# Patient Record
Sex: Male | Born: 1985 | Race: White | Hispanic: No | Smoking: Current every day smoker
Health system: Southern US, Community
[De-identification: ages and names within clinical notes are randomized; demographics above are authoritative.]

## PROBLEM LIST (undated history)

## (undated) DIAGNOSIS — N200 Calculus of kidney: Secondary | ICD-10-CM

## (undated) DIAGNOSIS — F4310 Post-traumatic stress disorder, unspecified: Secondary | ICD-10-CM

## (undated) DIAGNOSIS — S069XAA Unspecified intracranial injury with loss of consciousness status unknown, initial encounter: Secondary | ICD-10-CM

## (undated) DIAGNOSIS — S069X9A Unspecified intracranial injury with loss of consciousness of unspecified duration, initial encounter: Secondary | ICD-10-CM

## (undated) DIAGNOSIS — I10 Essential (primary) hypertension: Secondary | ICD-10-CM

---

## 2012-07-07 ENCOUNTER — Emergency Department (HOSPITAL_COMMUNITY)

## 2012-07-07 ENCOUNTER — Encounter (HOSPITAL_COMMUNITY): Payer: Self-pay | Admitting: Emergency Medicine

## 2012-07-07 ENCOUNTER — Emergency Department (INDEPENDENT_AMBULATORY_CARE_PROVIDER_SITE_OTHER)
Admission: EM | Admit: 2012-07-07 | Discharge: 2012-07-07 | Disposition: A | Discharge status: Nursing Home (Includes ICF) | Source: Home / Self Care | Attending: Family Medicine | Admitting: Family Medicine

## 2012-07-07 ENCOUNTER — Emergency Department (HOSPITAL_COMMUNITY)
Admission: EM | Admit: 2012-07-07 | Discharge: 2012-07-07 | Disposition: A | Discharge status: Home / Self Care | Attending: Emergency Medicine | Admitting: Emergency Medicine

## 2012-07-07 DIAGNOSIS — R109 Unspecified abdominal pain: Secondary | ICD-10-CM | POA: Insufficient documentation

## 2012-07-07 DIAGNOSIS — Z8659 Personal history of other mental and behavioral disorders: Secondary | ICD-10-CM | POA: Insufficient documentation

## 2012-07-07 DIAGNOSIS — R319 Hematuria, unspecified: Secondary | ICD-10-CM | POA: Insufficient documentation

## 2012-07-07 DIAGNOSIS — R1031 Right lower quadrant pain: Secondary | ICD-10-CM

## 2012-07-07 DIAGNOSIS — R110 Nausea: Secondary | ICD-10-CM | POA: Insufficient documentation

## 2012-07-07 DIAGNOSIS — F17200 Nicotine dependence, unspecified, uncomplicated: Secondary | ICD-10-CM | POA: Insufficient documentation

## 2012-07-07 DIAGNOSIS — M545 Low back pain, unspecified: Secondary | ICD-10-CM | POA: Insufficient documentation

## 2012-07-07 DIAGNOSIS — Z87442 Personal history of urinary calculi: Secondary | ICD-10-CM | POA: Insufficient documentation

## 2012-07-07 DIAGNOSIS — R52 Pain, unspecified: Secondary | ICD-10-CM | POA: Insufficient documentation

## 2012-07-07 DIAGNOSIS — Z87820 Personal history of traumatic brain injury: Secondary | ICD-10-CM | POA: Insufficient documentation

## 2012-07-07 DIAGNOSIS — I10 Essential (primary) hypertension: Secondary | ICD-10-CM | POA: Insufficient documentation

## 2012-07-07 HISTORY — DX: Essential (primary) hypertension: I10

## 2012-07-07 HISTORY — DX: Unspecified intracranial injury with loss of consciousness status unknown, initial encounter: S06.9XAA

## 2012-07-07 HISTORY — DX: Post-traumatic stress disorder, unspecified: F43.10

## 2012-07-07 HISTORY — DX: Unspecified intracranial injury with loss of consciousness of unspecified duration, initial encounter: S06.9X9A

## 2012-07-07 LAB — POCT URINALYSIS DIP (DEVICE)
Glucose, UA: NEGATIVE mg/dL
Leukocytes, UA: NEGATIVE
Nitrite: NEGATIVE
Specific Gravity, Urine: 1.025 (ref 1.005–1.030)
Urobilinogen, UA: 0.2 mg/dL (ref 0.0–1.0)

## 2012-07-07 LAB — URINALYSIS, ROUTINE W REFLEX MICROSCOPIC
Glucose, UA: NEGATIVE mg/dL
Hgb urine dipstick: NEGATIVE
Leukocytes, UA: NEGATIVE
Specific Gravity, Urine: 1.022 (ref 1.005–1.030)
Urobilinogen, UA: 0.2 mg/dL (ref 0.0–1.0)

## 2012-07-07 LAB — CBC WITH DIFFERENTIAL/PLATELET
Lymphocytes Relative: 29 % (ref 12–46)
Lymphs Abs: 2.6 10*3/uL (ref 0.7–4.0)
Neutrophils Relative %: 60 % (ref 43–77)
Platelets: 298 10*3/uL (ref 150–400)
RBC: 5.08 MIL/uL (ref 4.22–5.81)
WBC: 8.8 10*3/uL (ref 4.0–10.5)

## 2012-07-07 LAB — BASIC METABOLIC PANEL
CO2: 27 mEq/L (ref 19–32)
GFR calc non Af Amer: >90 mL/min (ref >90)
Glucose, Bld: 89 mg/dL (ref 70–99)
Potassium: 4.2 mEq/L (ref 3.5–5.1)
Sodium: 142 mEq/L (ref 135–145)

## 2012-07-07 MED ORDER — PROMETHAZINE HCL 25 MG/ML IJ SOLN
25.0000 mg | INTRAMUSCULAR | Status: AC
  Administered 2012-07-07: 25 mg via INTRAMUSCULAR
  Filled 2012-07-07: qty 1

## 2012-07-07 MED ORDER — CIPROFLOXACIN HCL 500 MG PO TABS: 500.0000 mg | ORAL_TABLET | Freq: Two times a day (BID) | ORAL | Status: DC

## 2012-07-07 MED ORDER — HYDROMORPHONE HCL PF 1 MG/ML IJ SOLN
1.0000 mg | Freq: Once | INTRAMUSCULAR | Status: AC
  Administered 2012-07-07: 1 mg via INTRAMUSCULAR
  Filled 2012-07-07: qty 1

## 2012-07-07 MED ORDER — HYDROCODONE-ACETAMINOPHEN 5-325 MG PO TABS: 2.0000 | ORAL_TABLET | ORAL | Status: DC | PRN

## 2012-07-07 NOTE — ED Notes (Signed)
Unable to obtain e-signature due to signature pad not working.

## 2012-07-07 NOTE — ED Notes (Signed)
Pt c/o back pain and dysuria. Hx of kidney stones. Pt c/o right lower quadrant pain. Acute on set x last night. Pt has increased water intake

## 2012-07-07 NOTE — ED Provider Notes (Signed)
History     CSN: [id number]  Arrival date & time 07/07/12  1509   First MD Initiated Contact with Patient 07/07/12 1734      Chief Complaint  Patient presents with  . Abdominal Pain  . Hematuria  . Back Pain    (Consider location/radiation/quality/duration/timing/severity/associated sxs/prior treatment) HPI Comments: Patient presents to the ER with complaints of right-sided low back pain with pain radiating into his right lower abdomen. Symptoms began last night. He is at moderate to severe pain. Recent reports that he has noticed blood in his urine. He has had nausea without vomiting. There is no fever. Patient does report this is similar to when he has had kidney stones in the past. Patient return to care today and was sent to the ER because he had tenderness in the right lower abdomen area.  Patient is a 27 y.o. male presenting with abdominal pain, hematuria, and back pain.  Abdominal Pain Associated symptoms: hematuria   Hematuria Associated symptoms include abdominal pain.  Back Pain Associated symptoms: abdominal pain     Past Medical History  Diagnosis Date  . Hypertension   . PTSD (post-traumatic stress disorder)   . Traumatic brain injury     History reviewed. No pertinent past surgical history.  No family history on file.  History  Substance Use Topics  . Smoking status: Current Every Day Smoker -- 0.50 packs/day    Types: Cigarettes  . Smokeless tobacco: Not on file  . Alcohol Use: No     Comment: Quit 06/27/2012      Review of Systems  Gastrointestinal: Positive for abdominal pain.  Genitourinary: Positive for hematuria and flank pain.  Musculoskeletal: Positive for back pain.  All other systems reviewed and are negative.    Allergies  Review of patient's allergies indicates no known allergies.  Home Medications  No current outpatient prescriptions on file.  BP 123/77  Pulse 70  Temp(Src) 98.2 F (36.8 C) (Oral)  Resp 18  SpO2  96%  Physical Exam  Constitutional: He is oriented to person, place, and time. He appears well-developed and well-nourished. No distress.  HENT:  Head: Normocephalic and atraumatic.  Right Ear: Hearing normal.  Nose: Nose normal.  Mouth/Throat: Oropharynx is clear and moist and mucous membranes are normal.  Eyes: Conjunctivae and EOM are normal. Pupils are equal, round, and reactive to light.  Neck: Normal range of motion. Neck supple.  Cardiovascular: Normal rate, regular rhythm, S1 normal and S2 normal.  Exam reveals no gallop and no friction rub.   No murmur heard. Pulmonary/Chest: Effort normal and breath sounds normal. No respiratory distress. He exhibits no tenderness.  Abdominal: Soft. Normal appearance and bowel sounds are normal. There is no hepatosplenomegaly. There is tenderness in the right lower quadrant. There is no rebound, no guarding, no tenderness at McBurney's point and negative Murphy's sign. No hernia.  Musculoskeletal: Normal range of motion.  Neurological: He is alert and oriented to person, place, and time. He has normal strength. No cranial nerve deficit or sensory deficit. Coordination normal. GCS eye subscore is 4. GCS verbal subscore is 5. GCS motor subscore is 6.  Skin: Skin is warm, dry and intact. No rash noted. No cyanosis.  Psychiatric: He has a normal mood and affect. His speech is normal and behavior is normal. Thought content normal.    ED Course  Procedures (including critical care time)  Results for orders placed during the hospital encounter of 07/07/12 (from the past 24 hour(s))  CBC  WITH DIFFERENTIAL     Status: Abnormal   Collection Time    07/07/12  4:00 PM      Result Value Range   WBC 8.8  4.0 - 10.5 K/uL   RBC 5.08  4.22 - 5.81 MIL/uL   Hemoglobin 15.9  13.0 - 17.0 g/dL   HCT 16.1  09.6 - 04.5 %   MCV 84.3  78.0 - 100.0 fL   MCH 31.3  26.0 - 34.0 pg   MCHC 37.0 (*) 30.0 - 36.0 g/dL   RDW 40.9  81.1 - 91.4 %   Platelets 298  150 - 400  K/uL   Neutrophils Relative 60  43 - 77 %   Neutro Abs 5.3  1.7 - 7.7 K/uL   Lymphocytes Relative 29  12 - 46 %   Lymphs Abs 2.6  0.7 - 4.0 K/uL   Monocytes Relative 10  3 - 12 %   Monocytes Absolute 0.8  0.1 - 1.0 K/uL   Eosinophils Relative 1  0 - 5 %   Eosinophils Absolute 0.1  0.0 - 0.7 K/uL   Basophils Relative 1  0 - 1 %   Basophils Absolute 0.0  0.0 - 0.1 K/uL  BASIC METABOLIC PANEL     Status: None   Collection Time    07/07/12  4:00 PM      Result Value Range   Sodium 142  135 - 145 mEq/L   Potassium 4.2  3.5 - 5.1 mEq/L   Chloride 105  96 - 112 mEq/L   CO2 27  19 - 32 mEq/L   Glucose, Bld 89  70 - 99 mg/dL   BUN 11  6 - 23 mg/dL   Creatinine, Ser 7.82  0.50 - 1.35 mg/dL   Calcium 9.9  8.4 - 95.6 mg/dL   GFR calc non Af Amer >90  >90 mL/min   GFR calc Af Amer >90  >90 mL/min  URINALYSIS, ROUTINE W REFLEX MICROSCOPIC     Status: None   Collection Time    07/07/12  8:29 PM      Result Value Range   Color, Urine YELLOW  YELLOW   APPearance CLEAR  CLEAR   Specific Gravity, Urine 1.022  1.005 - 1.030   pH 5.5  5.0 - 8.0   Glucose, UA NEGATIVE  NEGATIVE mg/dL   Hgb urine dipstick NEGATIVE  NEGATIVE   Bilirubin Urine NEGATIVE  NEGATIVE   Ketones, ur NEGATIVE  NEGATIVE mg/dL   Protein, ur NEGATIVE  NEGATIVE mg/dL   Urobilinogen, UA 0.2  0.0 - 1.0 mg/dL   Nitrite NEGATIVE  NEGATIVE   Leukocytes, UA NEGATIVE  NEGATIVE    Diagnosis: 1. Flank pain 2. Abdominal pain 3. Hematuria    MDM  Patient presents to the ER with complaints of pain in his right back, flank and abdomen area. He has a history of kidney stones. He has been noticing hematuria. This seems consistent with renal colic, but patient was noted to be tender to palpation by urgent care and sent to the ER for further evaluation. Once again he was tender without guarding or rebound here in the ER. Workup including CAT scan, however, did not show an obvious reason for the symptoms. He had a normal appendix. He does  have renal stones but no ureterolithiasis. Can't rule out passed stone. Hematuria might be secondary to the renal stones, but will refer to urology for further evaluation.        Canary Brim.  Blinda Leatherwood, MD 07/08/12 1535

## 2012-07-07 NOTE — ED Notes (Signed)
Pt c/o lower abdominal pain and low back pain onset last night. Pt had 2 episodes of blood in urine. Pt has history kidney stones. Pt reports nausea denies vomiting.

## 2012-07-07 NOTE — ED Provider Notes (Addendum)
History     CSN: [id number]  Arrival date & time 07/07/12  1246   First MD Initiated Contact with Patient 07/07/12 1301      Chief Complaint  Patient presents with  . Back Pain    and lower right quadrant pain. acute on set. hx of kidney stomes.     (Consider location/radiation/quality/duration/timing/severity/associated sxs/prior treatment) Patient is a 27 y.o. male presenting with abdominal pain. The history is provided by the patient.  Abdominal Pain Pain location:  RLQ Pain quality: aching   Pain radiates to:  R flank Pain severity:  Moderate Duration:  1 day Timing:  Constant Progression:  Worsening Chronicity:  New Relieved by:  Nothing Worsened by:  Nothing tried Associated symptoms: nausea   Associated symptoms: no constipation, no diarrhea, no fever and no vomiting   Associated symptoms comment:  Sx of tenesmus. Risk factors comment:  H/o kidney stone last yr.   Past Medical History  Diagnosis Date  . Hypertension   . PTSD (post-traumatic stress disorder)     History reviewed. No pertinent past surgical history.  History reviewed. No pertinent family history.  History  Substance Use Topics  . Smoking status: Not on file  . Smokeless tobacco: Not on file  . Alcohol Use: Yes      Review of Systems  Constitutional: Negative.  Negative for fever.  Gastrointestinal: Positive for nausea, abdominal pain and rectal pain. Negative for vomiting, diarrhea and constipation.  Genitourinary: Negative for urgency and testicular pain.  Musculoskeletal: Positive for back pain.    Allergies  Review of patient's allergies indicates not on file.  Home Medications   Current Outpatient Rx  Name  Route  Sig  Dispense  Refill  . PARoxetine HCl (PAXIL PO)   Oral   Take by mouth.         . traZODone (DESYREL) 100 MG tablet   Oral   Take 100 mg by mouth at bedtime.           BP 126/75  Pulse 70  Temp(Src) 98.2 F (36.8 C) (Oral)  Resp 18  SpO2  100%  Physical Exam  Nursing note and vitals reviewed. Constitutional: He is oriented to person, place, and time. He appears well-developed and well-nourished. No distress.  Abdominal: Soft. Normal appearance. He exhibits no distension and no mass. Bowel sounds are decreased. There is no hepatosplenomegaly. There is tenderness in the right lower quadrant. There is tenderness at McBurney's point. There is no rigidity, no rebound, no guarding and no CVA tenderness.  Neurological: He is alert and oriented to person, place, and time.  Skin: Skin is warm and dry.    ED Course  Procedures (including critical care time)  Labs Reviewed  POCT URINALYSIS DIP (DEVICE) - Abnormal; Notable for the following:    Hgb urine dipstick LARGE (*)    All other components within normal limits   No results found.   1. Abdominal pain, acute, right lower quadrant       MDM  Sent for abd pain --possible appy, but presented to r/o kidney stone.  U/a pos for bld.       Linna Hoff, MD 07/07/12 1456  Linna Hoff, MD 07/07/12 [phone]

## 2012-07-23 ENCOUNTER — Emergency Department (HOSPITAL_BASED_OUTPATIENT_CLINIC_OR_DEPARTMENT_OTHER)

## 2012-07-23 ENCOUNTER — Encounter (HOSPITAL_BASED_OUTPATIENT_CLINIC_OR_DEPARTMENT_OTHER): Payer: Self-pay | Admitting: *Deleted

## 2012-07-23 ENCOUNTER — Emergency Department (HOSPITAL_BASED_OUTPATIENT_CLINIC_OR_DEPARTMENT_OTHER)
Admission: EM | Admit: 2012-07-23 | Discharge: 2012-07-23 | Disposition: A | Discharge status: Home / Self Care | Attending: Emergency Medicine | Admitting: Emergency Medicine

## 2012-07-23 DIAGNOSIS — Z87820 Personal history of traumatic brain injury: Secondary | ICD-10-CM | POA: Insufficient documentation

## 2012-07-23 DIAGNOSIS — F17200 Nicotine dependence, unspecified, uncomplicated: Secondary | ICD-10-CM | POA: Insufficient documentation

## 2012-07-23 DIAGNOSIS — I10 Essential (primary) hypertension: Secondary | ICD-10-CM | POA: Insufficient documentation

## 2012-07-23 DIAGNOSIS — J329 Chronic sinusitis, unspecified: Secondary | ICD-10-CM | POA: Insufficient documentation

## 2012-07-23 DIAGNOSIS — Z8659 Personal history of other mental and behavioral disorders: Secondary | ICD-10-CM | POA: Insufficient documentation

## 2012-07-23 DIAGNOSIS — S0993XA Unspecified injury of face, initial encounter: Secondary | ICD-10-CM | POA: Insufficient documentation

## 2012-07-23 MED ORDER — AMOXICILLIN 500 MG PO CAPS: 500.0000 mg | ORAL_CAPSULE | Freq: Three times a day (TID) | ORAL | Status: DC
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
## 2012-07-23 NOTE — ED Provider Notes (Signed)
Medical screening examination/treatment/procedure(s) were performed by non-physician practitioner and as supervising physician I was immediately available for consultation/collaboration.   Glynn Octave, MD 07/23/12 [phone]

## 2012-09-02 ENCOUNTER — Encounter (HOSPITAL_COMMUNITY): Payer: Self-pay | Admitting: *Deleted

## 2012-09-02 ENCOUNTER — Emergency Department (HOSPITAL_COMMUNITY)

## 2012-09-02 ENCOUNTER — Emergency Department (HOSPITAL_COMMUNITY)
Admission: EM | Admit: 2012-09-02 | Discharge: 2012-09-02 | Disposition: A | Discharge status: Home / Self Care | Attending: Emergency Medicine | Admitting: Emergency Medicine

## 2012-09-02 DIAGNOSIS — Z8659 Personal history of other mental and behavioral disorders: Secondary | ICD-10-CM | POA: Insufficient documentation

## 2012-09-02 DIAGNOSIS — R109 Unspecified abdominal pain: Secondary | ICD-10-CM

## 2012-09-02 DIAGNOSIS — Z79899 Other long term (current) drug therapy: Secondary | ICD-10-CM | POA: Insufficient documentation

## 2012-09-02 DIAGNOSIS — F17200 Nicotine dependence, unspecified, uncomplicated: Secondary | ICD-10-CM | POA: Insufficient documentation

## 2012-09-02 DIAGNOSIS — R1031 Right lower quadrant pain: Secondary | ICD-10-CM | POA: Insufficient documentation

## 2012-09-02 DIAGNOSIS — Z87820 Personal history of traumatic brain injury: Secondary | ICD-10-CM | POA: Insufficient documentation

## 2012-09-02 DIAGNOSIS — I10 Essential (primary) hypertension: Secondary | ICD-10-CM | POA: Insufficient documentation

## 2012-09-02 DIAGNOSIS — Z87442 Personal history of urinary calculi: Secondary | ICD-10-CM | POA: Insufficient documentation

## 2012-09-02 HISTORY — DX: Calculus of kidney: N20.0

## 2012-09-02 LAB — POCT I-STAT, CHEM 8
BUN: 18 mg/dL (ref 6–23)
Calcium, Ion: 1.09 mmol/L — ABNORMAL LOW (ref 1.12–1.23)
HCT: 45 % (ref 39.0–52.0)
Hemoglobin: 15.3 g/dL (ref 13.0–17.0)
Sodium: 139 mEq/L (ref 135–145)
TCO2: 25 mmol/L (ref 0–100)

## 2012-09-02 LAB — URINALYSIS, ROUTINE W REFLEX MICROSCOPIC
Glucose, UA: NEGATIVE mg/dL
Leukocytes, UA: NEGATIVE
Nitrite: NEGATIVE
Protein, ur: NEGATIVE mg/dL

## 2012-09-02 MED ORDER — ONDANSETRON HCL 4 MG/2ML IJ SOLN
4.0000 mg | Freq: Once | INTRAMUSCULAR | Status: AC
  Administered 2012-09-02: 4 mg via INTRAVENOUS
  Filled 2012-09-02: qty 2

## 2012-09-02 MED ORDER — HYDROMORPHONE HCL PF 1 MG/ML IJ SOLN
1.0000 mg | Freq: Once | INTRAMUSCULAR | Status: DC
  Filled 2012-09-02: qty 1

## 2012-09-02 NOTE — ED Notes (Signed)
Rt anterior flank pain, has history of kidney stones, able to urinate small amt PTA

## 2012-09-02 NOTE — ED Notes (Signed)
Patient transported to X-ray 

## 2012-09-02 NOTE — ED Provider Notes (Signed)
History     CSN: [id number]  Arrival date & time 09/02/12  1932   First MD Initiated Contact with Patient 09/02/12 2114      Chief Complaint  Patient presents with  . Flank Pain    rt    (Consider location/radiation/quality/duration/timing/severity/associated sxs/prior treatment) HPI Pt presents with c/o right flank pain and right lower abdominal pain.  Pt states he has had similar pain over the past 2 weeks, became worse tonight.  No fever, no vomiting.  Pt was seen in the ED approx 1 month ago for similar symptoms.  Pt states he has a hx of passing kidney stones and this feels similar.  He reports some burning with urination.  Denies seeing any blood in his urine.  He states that tonight in the ED he saw a small round hard thing in the toilet and wonders if this might have been a stone.  There are no other associated systemic symptoms, there are no other alleviating or modifying factors.   Past Medical History  Diagnosis Date  . Hypertension   . PTSD (post-traumatic stress disorder)   . Traumatic brain injury   . Kidney stones     History reviewed. No pertinent past surgical history.  No family history on file.  History  Substance Use Topics  . Smoking status: Current Every Day Smoker -- 0.50 packs/day    Types: Cigarettes  . Smokeless tobacco: Not on file  . Alcohol Use: No     Comment: Quit 06/27/2012      Review of Systems ROS reviewed and all otherwise negative except for mentioned in HPI  Allergies  Review of patient's allergies indicates no known allergies.  Home Medications   Current Outpatient Rx  Name  Route  Sig  Dispense  Refill  . ALPRAZolam (XANAX) 0.5 MG tablet   Oral   Take 0.5 mg by mouth at bedtime as needed for sleep.         Marland Kitchen HYDROcodone-acetaminophen (NORCO/VICODIN) 5-325 MG per tablet   Oral   Take 2 tablets by mouth every 4 (four) hours as needed for pain.   15 tablet   0   . amoxicillin (AMOXIL) 500 MG capsule   Oral   Take 1  capsule (500 mg total) by mouth 3 (three) times daily.   30 capsule   0   . ciprofloxacin (CIPRO) 500 MG tablet   Oral   Take 1 tablet (500 mg total) by mouth 2 (two) times daily. One po bid x 7 days   14 tablet   0     BP 121/76  Pulse 54  Temp(Src) 98.5 F (36.9 C) (Oral)  Resp 18  SpO2 98% Vitals reviewed Physical Exam Physical Examination: General appearance - alert, well appearing, and in no distress Mental status - alert, oriented to person, place, and time Eyes - no conjunctival injection, no scleral icterus Mouth - mucous membranes moist, pharynx normal without lesions Chest - clear to auscultation, no wheezes, rales or rhonchi, symmetric air entry Heart - normal rate, regular rhythm, normal S1, S2, no murmurs, rubs, clicks or gallops Abdomen - soft, mild ttp in right lower abdomen, no gaurding or rebound, nondistended, no masses or organomegaly Back exam - full range of motion, no midline tenderness, some right CVA tenderness Extremities - peripheral pulses normal, no pedal edema, no clubbing or cyanosis Skin - normal coloration and turgor, no rashes  ED Course  Procedures (including critical care time)  Labs Reviewed  URINALYSIS,  ROUTINE W REFLEX MICROSCOPIC - Abnormal; Notable for the following:    Specific Gravity, Urine 1.031 (*)    All other components within normal limits  POCT I-STAT, CHEM 8 - Abnormal; Notable for the following:    Calcium, Ion 1.09 (*)    All other components within normal limits   Ct Abdomen Pelvis Wo Contrast  09/02/2012   *RADIOLOGY REPORT*  Clinical Data: Right flank pain with hematuria  CT ABDOMEN AND PELVIS WITHOUT CONTRAST  Technique:  Multidetector CT imaging of the abdomen and pelvis was performed following the standard protocol without intravenous contrast.  Comparison: CT 07/07/2012  Findings: Small nonobstructing renal calculi bilaterally are similar to the prior study.  Large stone in the right lower pole measures 6 mm.  No  ureteral or bladder calculi.  Lung bases are clear.  Liver and bile ducts are normal. Gallbladder is contracted.  Pancreas and spleen are normal.  Negative for bowel obstruction or bowel thickening.  Appendix is normal.  IMPRESSION: Small nonobstructing renal calculi bilaterally.  No acute abnormality.   Original Report Authenticated By: Janeece Riggers, M.D.     1. Abdominal pain       MDM  Pt presenting with c/o right flank pain and right lower abdominal pain.  Pt has no findings of ureteral stone or appendicitis on CT scan.  He did have a similar visit to this recently and states he did follow up with urology for this- did not proceed with further testing through them.  I have discussed all CT scan results with him.  Discharged with strict return precautions.  Pt agreeable with plan.        Ethelda Chick, MD 09/04/12 1736

## 2013-12-24 ENCOUNTER — Encounter (HOSPITAL_BASED_OUTPATIENT_CLINIC_OR_DEPARTMENT_OTHER): Payer: Self-pay | Admitting: Emergency Medicine

## 2013-12-24 ENCOUNTER — Emergency Department (HOSPITAL_BASED_OUTPATIENT_CLINIC_OR_DEPARTMENT_OTHER)
Admission: EM | Admit: 2013-12-24 | Discharge: 2013-12-24 | Disposition: A | Discharge status: Home / Self Care | Attending: Emergency Medicine | Admitting: Emergency Medicine

## 2013-12-24 DIAGNOSIS — Z720 Tobacco use: Secondary | ICD-10-CM | POA: Insufficient documentation

## 2013-12-24 DIAGNOSIS — I10 Essential (primary) hypertension: Secondary | ICD-10-CM | POA: Insufficient documentation

## 2013-12-24 DIAGNOSIS — Z79899 Other long term (current) drug therapy: Secondary | ICD-10-CM | POA: Insufficient documentation

## 2013-12-24 DIAGNOSIS — R000 Tachycardia, unspecified: Secondary | ICD-10-CM | POA: Insufficient documentation

## 2013-12-24 DIAGNOSIS — Z8659 Personal history of other mental and behavioral disorders: Secondary | ICD-10-CM | POA: Insufficient documentation

## 2013-12-24 DIAGNOSIS — R109 Unspecified abdominal pain: Secondary | ICD-10-CM

## 2013-12-24 DIAGNOSIS — Z792 Long term (current) use of antibiotics: Secondary | ICD-10-CM | POA: Insufficient documentation

## 2013-12-24 DIAGNOSIS — Z87820 Personal history of traumatic brain injury: Secondary | ICD-10-CM | POA: Insufficient documentation

## 2013-12-24 DIAGNOSIS — N200 Calculus of kidney: Secondary | ICD-10-CM | POA: Insufficient documentation

## 2013-12-24 LAB — URINALYSIS, ROUTINE W REFLEX MICROSCOPIC
BILIRUBIN URINE: NEGATIVE
Glucose, UA: NEGATIVE mg/dL
Ketones, ur: NEGATIVE mg/dL
Nitrite: NEGATIVE
PH: 6.5 (ref 5.0–8.0)
Protein, ur: NEGATIVE mg/dL
SPECIFIC GRAVITY, URINE: 1.016 (ref 1.005–1.030)
Urobilinogen, UA: 0.2 mg/dL (ref 0.0–1.0)

## 2013-12-24 LAB — URINE MICROSCOPIC-ADD ON

## 2013-12-24 MED ORDER — ONDANSETRON 4 MG PO TBDP: ORAL_TABLET | ORAL | Status: DC

## 2013-12-24 MED ORDER — KETOROLAC TROMETHAMINE 60 MG/2ML IM SOLN: 60.0000 mg | Freq: Once | INTRAMUSCULAR | Status: DC

## 2013-12-24 MED ORDER — OXYCODONE-ACETAMINOPHEN 5-325 MG PO TABS: 1.0000 | ORAL_TABLET | Freq: Four times a day (QID) | ORAL | Status: AC | PRN

## 2013-12-24 MED ORDER — TAMSULOSIN HCL 0.4 MG PO CAPS: 0.4000 mg | ORAL_CAPSULE | Freq: Every day | ORAL | Status: AC

## 2013-12-24 MED ORDER — KETOROLAC TROMETHAMINE 30 MG/ML IJ SOLN
30.0000 mg | Freq: Once | INTRAMUSCULAR | Status: AC
  Administered 2013-12-24: 30 mg via INTRAVENOUS
  Filled 2013-12-24: qty 1

## 2013-12-24 NOTE — Discharge Instructions (Signed)
Take percocet for severe pain only. No driving or operating heavy machinery while taking percocet. This medication may cause drowsiness. Take zofran as directed as needed for nausea. Take flomax as directed.  Flank Pain Flank pain refers to pain that is located on the side of the body between the upper abdomen and the back. The pain may occur over a short period of time (acute) or may be long-term or reoccurring (chronic). It may be mild or severe. Flank pain can be caused by many things. CAUSES  Some of the more common causes of flank pain include:  Muscle strains.   Muscle spasms.   A disease of your spine (vertebral disk disease).   A lung infection (pneumonia).   Fluid around your lungs (pulmonary edema).   A kidney infection.   Kidney stones.   A very painful skin rash caused by the chickenpox virus (shingles).   Gallbladder disease.  HOME CARE INSTRUCTIONS  Home care will depend on the cause of your pain. In general,  Rest as directed by your caregiver.  Drink enough fluids to keep your urine clear or pale yellow.  Only take over-the-counter or prescription medicines as directed by your caregiver. Some medicines may help relieve the pain.  Tell your caregiver about any changes in your pain.  Follow up with your caregiver as directed. SEEK IMMEDIATE MEDICAL CARE IF:   Your pain is not controlled with medicine.   You have new or worsening symptoms.  Your pain increases.   You have abdominal pain.   You have shortness of breath.   You have persistent nausea or vomiting.   You have swelling in your abdomen.   You feel faint or pass out.   You have blood in your urine.  You have a fever or persistent symptoms for more than 2-3 days.  You have a fever and your symptoms suddenly get worse. MAKE SURE YOU:   Understand these instructions.  Will watch your condition.  Will get help right away if you are not doing well or get  worse. Document Released: 04/21/2005 Document Revised: 11/23/2011 Document Reviewed: 10/13/2011 [facility] Patient Information 2015 [place], [place]. This information is not intended to replace advice given to you by your health care provider. Make sure you discuss any questions you have with your health care provider.  Kidney Stones Kidney stones (urolithiasis) are deposits that form inside your kidneys. The intense pain is caused by the stone moving through the urinary tract. When the stone moves, the ureter goes into spasm around the stone. The stone is usually passed in the urine.  CAUSES   A disorder that makes certain neck glands produce too much parathyroid hormone (primary hyperparathyroidism).  A buildup of uric acid crystals, similar to gout in your joints.  Narrowing (stricture) of the ureter.  A kidney obstruction present at birth (congenital obstruction).  Previous surgery on the kidney or ureters.  Numerous kidney infections. SYMPTOMS   Feeling sick to your stomach (nauseous).  Throwing up (vomiting).  Blood in the urine (hematuria).  Pain that usually spreads (radiates) to the groin.  Frequency or urgency of urination. DIAGNOSIS   Taking a history and physical exam.  Blood or urine tests.  CT scan.  Occasionally, an examination of the inside of the urinary bladder (cystoscopy) is performed. TREATMENT   Observation.  Increasing your fluid intake.  Extracorporeal shock wave lithotripsy--This is a noninvasive procedure that uses shock waves to break up kidney stones.  Surgery may be needed if  you have severe pain or persistent obstruction. There are various surgical procedures. Most of the procedures are performed with the use of small instruments. Only small incisions are needed to accommodate these instruments, so recovery time is minimized. The size, location, and chemical composition are all important variables that will determine the proper choice of  action for you. Talk to your health care provider to better understand your situation so that you will minimize the risk of injury to yourself and your kidney.  HOME CARE INSTRUCTIONS   Drink enough water and fluids to keep your urine clear or pale yellow. This will help you to pass the stone or stone fragments.  Strain all urine through the provided strainer. Keep all particulate matter and stones for your health care provider to see. The stone causing the pain may be as small as a grain of salt. It is very important to use the strainer each and every time you pass your urine. The collection of your stone will allow your health care provider to analyze it and verify that a stone has actually passed. The stone analysis will often identify what you can do to reduce the incidence of recurrences.  Only take over-the-counter or prescription medicines for pain, discomfort, or fever as directed by your health care provider.  Make a follow-up appointment with your health care provider as directed.  Get follow-up X-rays if required. The absence of pain does not always mean that the stone has passed. It may have only stopped moving. If the urine remains completely obstructed, it can cause loss of kidney function or even complete destruction of the kidney. It is your responsibility to make sure X-rays and follow-ups are completed. Ultrasounds of the kidney can show blockages and the status of the kidney. Ultrasounds are not associated with any radiation and can be performed easily in a matter of minutes. SEEK MEDICAL CARE IF:  You experience pain that is progressive and unresponsive to any pain medicine you have been prescribed. SEEK IMMEDIATE MEDICAL CARE IF:   Pain cannot be controlled with the prescribed medicine.  You have a fever or shaking chills.  The severity or intensity of pain increases over 18 hours and is not relieved by pain medicine.  You develop a new onset of abdominal pain.  You feel  faint or pass out.  You are unable to urinate. MAKE SURE YOU:   Understand these instructions.  Will watch your condition.  Will get help right away if you are not doing well or get worse. Document Released: 02/28/2005 Document Revised: 10/31/2012 Document Reviewed: 08/01/2012 [facility]Exit[facility] Patient Information 2015 [place], [place]. This information is not intended to replace advice given to you by your health care provider. Make sure you discuss any questions you have with your health care provider.

## 2013-12-24 NOTE — ED Notes (Addendum)
Right flank pain since last night. Hx of kidney stones. States it feels like he has a stone.

## 2013-12-24 NOTE — ED Provider Notes (Signed)
Medical screening examination/treatment/procedure(s) were performed by non-physician practitioner and as supervising physician I was immediately available for consultation/collaboration.   EKG Interpretation None       Vanetta MuldersScott Marlowe Lawes, MD 12/24/13 1652

## 2013-12-24 NOTE — ED Notes (Signed)
Urine Strainer given for home use. Pt directed to pharmacy to pick up medications

## 2013-12-24 NOTE — ED Provider Notes (Signed)
CSN: [id number]     Arrival date & time 12/24/13  1508 History   First MD Initiated Contact with Patient 12/24/13 1612     Chief Complaint  Patient presents with  . Flank Pain     (Consider location/radiation/quality/duration/timing/severity/associated sxs/prior Treatment) HPI Comments: This is a 28 year old male with a past medical history of hypertension, PTSD, somatic brain injury and kidney stones who presents to the emergency department complaining of right-sided flank pain x3 days, worsening yesterday evening. Pain is constant, worse with sitting down, slightly relieved when standing. States this feels like his kidney stone that he had in the past. Pain slightly relieved after he urinates. Denies dysuria or hematuria. Admits to nausea without vomiting. No fever or chills.  Patient is a 28 y.o. male presenting with flank pain. The history is provided by the patient.  Flank Pain    Past Medical History  Diagnosis Date  . Hypertension   . PTSD (post-traumatic stress disorder)   . Traumatic brain injury   . Kidney stones    History reviewed. No pertinent past surgical history. No family history on file. History  Substance Use Topics  . Smoking status: Current Every Day Smoker -- 0.50 packs/day    Types: Cigarettes  . Smokeless tobacco: Not on file  . Alcohol Use: No     Comment: Quit 06/27/2012    Review of Systems  Genitourinary: Positive for flank pain.  All other systems reviewed and are negative.     Allergies  Review of patient's allergies indicates no known allergies.  Home Medications   Prior to Admission medications   Medication Sig Start Date End Date Taking? Authorizing Provider  ALPRAZolam Prudy Feeler(XANAX) 0.5 MG tablet Take 0.5 mg by mouth at bedtime as needed for sleep.    Historical Provider, MD  amoxicillin (AMOXIL) 500 MG capsule Take 1 capsule (500 mg total) by mouth 3 (three) times daily. 07/23/12   Teressa LowerVrinda Pickering, NP  ciprofloxacin (CIPRO) 500 MG tablet  Take 1 tablet (500 mg total) by mouth 2 (two) times daily. One po bid x 7 days 07/07/12   Gilda Creasehristopher J. Pollina, MD  HYDROcodone-acetaminophen (NORCO/VICODIN) 5-325 MG per tablet Take 2 tablets by mouth every 4 (four) hours as needed for pain. 07/07/12   Gilda Creasehristopher J. Pollina, MD  ondansetron (ZOFRAN ODT) 4 MG disintegrating tablet 4mg  ODT q4 hours prn nausea/vomit 12/24/13   Tayla Panozzo M Amil Moseman, PA-C  oxyCODONE-acetaminophen (PERCOCET) 5-325 MG per tablet Take 1-2 tablets by mouth every 6 (six) hours as needed for severe pain. 12/24/13   Kathrynn Speedobyn M Divine Hansley, PA-C  tamsulosin (FLOMAX) 0.4 MG CAPS capsule Take 1 capsule (0.4 mg total) by mouth daily. 12/24/13   Ranata Laughery M Jorey Dollard, PA-C   BP 132/84  Pulse 106  Temp(Src) 98.3 F (36.8 C) (Oral)  Resp 20  Ht 5\' 9"  (1.753 m)  Wt 155 lb (70.308 kg)  BMI 22.88 kg/m2  SpO2 98% Physical Exam  Nursing note and vitals reviewed. Constitutional: He is oriented to person, place, and time. He appears well-developed and well-nourished. No distress.  Pacing around the room, uncomfortable.  HENT:  Head: Normocephalic and atraumatic.  Mouth/Throat: Oropharynx is clear and moist.  Eyes: Conjunctivae are normal.  Neck: Normal range of motion. Neck supple.  Cardiovascular: Regular rhythm and normal heart sounds.  Tachycardia present.   Tachy ~105.  Pulmonary/Chest: Effort normal and breath sounds normal.  Abdominal: Soft. Normal appearance and bowel sounds are normal. He exhibits no distension. There is no tenderness. There is  CVA tenderness (right).  Musculoskeletal: Normal range of motion. He exhibits no edema.  Neurological: He is alert and oriented to person, place, and time.  Skin: Skin is warm and dry. He is not diaphoretic.  Psychiatric: He has a normal mood and affect. His behavior is normal.    ED Course  Procedures (including critical care time) Labs Review Labs Reviewed  URINALYSIS, ROUTINE W REFLEX MICROSCOPIC - Abnormal; Notable for the following:    Hgb  urine dipstick SMALL (*)    Leukocytes, UA SMALL (*)    All other components within normal limits  URINE MICROSCOPIC-ADD ON    Imaging Review No results found.   EKG Interpretation None      MDM   Final diagnoses:  Right kidney stone  Right flank pain   Patient with right flank pain, he is pacing and appears uncomfortable, however in no apparent distress. Afebrile, mildly tachycardic, vitals otherwise stable. Abdomen is soft and nontender. Right sided CVA tenderness. Urinalysis obtained prior to patient being seen, small amount of blood noted, urine culture pending small leukocytes and 3-6 white blood cells. History of kidney stones, reports this feels. Clinically, this is a kidney stone. He is able to urinate without difficulty. No urinary tract obstruction. He has confirmed stones on CT scan from June 2014, discussed risk versus benefit of CT scan with patient, he does not want a scan at this time and would like to go home. Will discharge home with Percocet, Zofran and Flomax. Resources given for f/u. Stable for d/c. Return precautions given. Patient states understanding of treatment care plan and is agreeable.   Kathrynn SpeedRobyn M Hatsue Sime, PA-C 12/24/13 [phone]

## 2013-12-25 ENCOUNTER — Emergency Department (HOSPITAL_COMMUNITY)

## 2013-12-25 ENCOUNTER — Emergency Department (HOSPITAL_COMMUNITY)
Admission: EM | Admit: 2013-12-25 | Discharge: 2013-12-25 | Disposition: A | Discharge status: Home / Self Care | Attending: Emergency Medicine | Admitting: Emergency Medicine

## 2013-12-25 ENCOUNTER — Encounter (HOSPITAL_COMMUNITY): Payer: Self-pay | Admitting: Emergency Medicine

## 2013-12-25 DIAGNOSIS — Z720 Tobacco use: Secondary | ICD-10-CM | POA: Insufficient documentation

## 2013-12-25 DIAGNOSIS — N200 Calculus of kidney: Secondary | ICD-10-CM

## 2013-12-25 DIAGNOSIS — I10 Essential (primary) hypertension: Secondary | ICD-10-CM | POA: Insufficient documentation

## 2013-12-25 DIAGNOSIS — Z87820 Personal history of traumatic brain injury: Secondary | ICD-10-CM | POA: Insufficient documentation

## 2013-12-25 DIAGNOSIS — Z79899 Other long term (current) drug therapy: Secondary | ICD-10-CM | POA: Insufficient documentation

## 2013-12-25 LAB — CBC WITH DIFFERENTIAL/PLATELET
Basophils Absolute: 0 10*3/uL (ref 0.0–0.1)
Basophils Relative: 0 % (ref 0–1)
EOS PCT: 0 % (ref 0–5)
Eosinophils Absolute: 0 10*3/uL (ref 0.0–0.7)
HEMATOCRIT: 42.2 % (ref 39.0–52.0)
HEMOGLOBIN: 15.4 g/dL (ref 13.0–17.0)
LYMPHS ABS: 1.5 10*3/uL (ref 0.7–4.0)
Lymphocytes Relative: 9 % — ABNORMAL LOW (ref 12–46)
MCH: 31.4 pg (ref 26.0–34.0)
MCHC: 36.5 g/dL — AB (ref 30.0–36.0)
MCV: 86.1 fL (ref 78.0–100.0)
MONOS PCT: 7 % (ref 3–12)
Monocytes Absolute: 1.2 10*3/uL — ABNORMAL HIGH (ref 0.1–1.0)
NEUTROS PCT: 84 % — AB (ref 43–77)
Neutro Abs: 13.3 10*3/uL — ABNORMAL HIGH (ref 1.7–7.7)
Platelets: 252 10*3/uL (ref 150–400)
RBC: 4.9 MIL/uL (ref 4.22–5.81)
RDW: 11.5 % (ref 11.5–15.5)
WBC: 16.1 10*3/uL — ABNORMAL HIGH (ref 4.0–10.5)

## 2013-12-25 LAB — URINALYSIS, ROUTINE W REFLEX MICROSCOPIC
BILIRUBIN URINE: NEGATIVE
Glucose, UA: NEGATIVE mg/dL
KETONES UR: NEGATIVE mg/dL
LEUKOCYTES UA: NEGATIVE
NITRITE: NEGATIVE
Protein, ur: NEGATIVE mg/dL
SPECIFIC GRAVITY, URINE: 1.013 (ref 1.005–1.030)
Urobilinogen, UA: 0.2 mg/dL (ref 0.0–1.0)
pH: 8 (ref 5.0–8.0)

## 2013-12-25 LAB — COMPREHENSIVE METABOLIC PANEL
ALK PHOS: 70 U/L (ref 39–117)
ALT: 14 U/L (ref 0–53)
ANION GAP: 15 (ref 5–15)
AST: 17 U/L (ref 0–37)
Albumin: 4.1 g/dL (ref 3.5–5.2)
BUN: 12 mg/dL (ref 6–23)
CO2: 24 mEq/L (ref 19–32)
CREATININE: 1.32 mg/dL (ref 0.50–1.35)
Calcium: 9.2 mg/dL (ref 8.4–10.5)
Chloride: 104 mEq/L (ref 96–112)
GFR calc Af Amer: 84 mL/min — ABNORMAL LOW (ref >90)
GFR calc non Af Amer: 73 mL/min — ABNORMAL LOW (ref >90)
GLUCOSE: 112 mg/dL — AB (ref 70–99)
POTASSIUM: 3.8 meq/L (ref 3.7–5.3)
Sodium: 143 mEq/L (ref 137–147)
TOTAL PROTEIN: 7.1 g/dL (ref 6.0–8.3)
Total Bilirubin: 0.4 mg/dL (ref 0.3–1.2)

## 2013-12-25 LAB — URINE MICROSCOPIC-ADD ON

## 2013-12-25 MED ORDER — SODIUM CHLORIDE 0.9 % IV BOLUS (SEPSIS)
1000.0000 mL | Freq: Once | INTRAVENOUS | Status: AC
  Administered 2013-12-25: 1000 mL via INTRAVENOUS

## 2013-12-25 MED ORDER — IBUPROFEN 800 MG PO TABS: 800.0000 mg | ORAL_TABLET | Freq: Three times a day (TID) | ORAL | Status: AC | PRN

## 2013-12-25 MED ORDER — ONDANSETRON HCL 4 MG/2ML IJ SOLN
4.0000 mg | Freq: Once | INTRAMUSCULAR | Status: AC
  Administered 2013-12-25: 4 mg via INTRAVENOUS
  Filled 2013-12-25: qty 2

## 2013-12-25 MED ORDER — MORPHINE SULFATE 4 MG/ML IJ SOLN
6.0000 mg | Freq: Once | INTRAMUSCULAR | Status: AC
  Administered 2013-12-25: 6 mg via INTRAVENOUS
  Filled 2013-12-25: qty 2

## 2013-12-25 MED ORDER — OXYCODONE-ACETAMINOPHEN 5-325 MG PO TABS: 1.0000 | ORAL_TABLET | ORAL | Status: AC | PRN

## 2013-12-25 MED ORDER — PROMETHAZINE HCL 25 MG PO TABS: 25.0000 mg | ORAL_TABLET | Freq: Four times a day (QID) | ORAL | Status: AC | PRN

## 2013-12-25 MED ORDER — MORPHINE SULFATE 4 MG/ML IJ SOLN
4.0000 mg | Freq: Once | INTRAMUSCULAR | Status: AC
  Administered 2013-12-25: 4 mg via INTRAVENOUS
  Filled 2013-12-25: qty 1

## 2013-12-25 MED ORDER — KETOROLAC TROMETHAMINE 30 MG/ML IJ SOLN
30.0000 mg | Freq: Once | INTRAMUSCULAR | Status: AC
  Administered 2013-12-25: 30 mg via INTRAVENOUS
  Filled 2013-12-25: qty 1

## 2013-12-25 NOTE — ED Notes (Signed)
Per EMS: Pt states he was dx w/ kidney stones yesterday.  States he was giving prescription for pain meds but has not gotten them filled.  Comes in today w/ continuing pain.

## 2013-12-25 NOTE — ED Notes (Signed)
Pt asleep when RN walked into room. Once woken up, pt c/o 7/10 pain.

## 2013-12-25 NOTE — Discharge Instructions (Signed)

## 2013-12-25 NOTE — ED Provider Notes (Signed)
TIME SEEN: 12:45 PM  CHIEF COMPLAINT: Right flank pain  HPI: Patient is a 28 year old male with history of hypertension, PTSD, traumatic brain injury, nephrolithiasis who presents emergency Department right flank pain for the past several days. Was seen in the emergency department and was found to have hematuria. Diagnosed with nephrolithiasis and discharge with Percocet, Flomax. Patient reports his pain is been uncontrolled at home and he has had nausea. No vomiting or diarrhea. No fevers or chills. No dysuria or hematuria. No penile discharge, testicular swelling or pain. States this feels similar to his prior kidney stones.  ROS: See HPI Constitutional: no fever  Eyes: no drainage  ENT: no runny nose   Cardiovascular:  no chest pain  Resp: no SOB  GI: no vomiting GU: no dysuria Integumentary: no rash  Allergy: no hives  Musculoskeletal: no leg swelling  Neurological: no slurred speech ROS otherwise negative  PAST MEDICAL HISTORY/PAST SURGICAL HISTORY:  Past Medical History  Diagnosis Date  . Hypertension   . PTSD (post-traumatic stress disorder)   . Traumatic brain injury   . Kidney stones     MEDICATIONS:  Prior to Admission medications   Medication Sig Start Date End Date Taking? Authorizing Provider  HYDROcodone-acetaminophen (NORCO/VICODIN) 5-325 MG per tablet Take 1 tablet by mouth every 6 (six) hours as needed for moderate pain.   Yes Historical Provider, MD  oxyCODONE-acetaminophen (PERCOCET) 5-325 MG per tablet Take 1-2 tablets by mouth every 6 (six) hours as needed for severe pain. 12/24/13  Yes Robyn M Hess, PA-C  tamsulosin (FLOMAX) 0.4 MG CAPS capsule Take 1 capsule (0.4 mg total) by mouth daily. 12/24/13  Yes Kathrynn Speedobyn M Hess, PA-C    ALLERGIES:  No Known Allergies  SOCIAL HISTORY:  History  Substance Use Topics  . Smoking status: Current Every Day Smoker -- 0.50 packs/day    Types: Cigarettes  . Smokeless tobacco: Not on file  . Alcohol Use: No     Comment:  Quit 06/27/2012    FAMILY HISTORY: No family history on file.  EXAM: BP 122/66  Pulse 82  Temp(Src) 98.4 F (36.9 C) (Oral)  Resp 20  SpO2 100% CONSTITUTIONAL: Alert and oriented and responds appropriately to questions. Patient is nontoxic but appears uncomfortable HEAD: Normocephalic EYES: Conjunctivae clear, PERRL ENT: normal nose; no rhinorrhea; moist mucous membranes; pharynx without lesions noted NECK: Supple, no meningismus, no LAD  CARD: RRR; S1 and S2 appreciated; no murmurs, no clicks, no rubs, no gallops RESP: Normal chest excursion without splinting or tachypnea; breath sounds clear and equal bilaterally; no wheezes, no rhonchi, no rales,  ABD/GI: Normal bowel sounds; non-distended; soft, non-tender, no rebound, no guarding BACK:  The back appears normal and is non-tender to palpation, there is no CVA tenderness; no midline spinal tenderness or step-off or deformity EXT: Normal ROM in all joints; non-tender to palpation; no edema; normal capillary refill; no cyanosis    SKIN: Normal color for age and race; warm NEURO: Moves all extremities equally; sensation to light-touch intact diffusely PSYCH: The patient's mood and manner are appropriate. Grooming and personal hygiene are appropriate.  MEDICAL DECISION MAKING: Patient here with right flank pain that he reports is similar to his prior kidney stones. He reports his pain is markedly increased in uncontrolled with by mouth medications at home. We'll obtain labs and repeat a urinalysis today. We'll also obtain a CT of his abdomen and pelvis for further evaluation given his significant pain that is uncontrolled to evaluate location of stone and  to see if there is any sign of obstruction. We'll give a IV fluids, Zofran, morphine and Toradol.  ED PROGRESS: Patient's labs show leukocytosis with left shift which may be reactive. His creatinine is normal. Urine shows moderate hemoglobin but no other sign of infection. CT scan shows an  obstructing 3 mm stone at the distal right UVJ and nonobstructing stones in the kidneys bilaterally. His pain has been well-controlled after morphine, Zofran, Toradol. I feel he is safe to be discharged home. We'll discharge him with ibuprofen, Phenergan and more Percocet. We'll give urology outpatient followup information.     Layla MawKristen N Evadene Wardrip, DO 12/25/13 1433

## 2013-12-26 LAB — URINE CULTURE
COLONY COUNT: NO GROWTH
Culture: NO GROWTH

## 2014-04-19 ENCOUNTER — Encounter (HOSPITAL_COMMUNITY): Payer: Self-pay | Admitting: *Deleted

## 2014-04-19 ENCOUNTER — Emergency Department (INDEPENDENT_AMBULATORY_CARE_PROVIDER_SITE_OTHER)
Admission: EM | Admit: 2014-04-19 | Discharge: 2014-04-19 | Disposition: A | Discharge status: Home / Self Care | Payer: Self-pay | Source: Home / Self Care | Attending: Family Medicine | Admitting: Family Medicine

## 2014-04-19 DIAGNOSIS — K219 Gastro-esophageal reflux disease without esophagitis: Secondary | ICD-10-CM

## 2014-04-19 DIAGNOSIS — IMO0001 Reserved for inherently not codable concepts without codable children: Secondary | ICD-10-CM

## 2014-04-19 DIAGNOSIS — R079 Chest pain, unspecified: Secondary | ICD-10-CM

## 2014-04-19 DIAGNOSIS — F419 Anxiety disorder, unspecified: Secondary | ICD-10-CM

## 2014-04-19 MED ORDER — GI COCKTAIL ~~LOC~~
ORAL | Status: AC
  Filled 2014-04-19: qty 30

## 2014-04-19 MED ORDER — OMEPRAZOLE 40 MG PO CPDR: 40.0000 mg | DELAYED_RELEASE_CAPSULE | Freq: Every day | ORAL | Status: AC

## 2014-04-19 MED ORDER — GI COCKTAIL ~~LOC~~
30.0000 mL | Freq: Once | ORAL | Status: AC
  Administered 2014-04-19: 30 mL via ORAL

## 2014-04-19 NOTE — Discharge Instructions (Signed)
Your symptoms of chest pain are likely related to multiple things including anxiety, medication changes, too much caffeine, and reflux. Please significantly reduce her dose of caffeine, and start using acid reducer pill. Please go to the emergency room if her symptoms return or worsen.

## 2014-04-19 NOTE — ED Provider Notes (Signed)
CSN: [id number]     Arrival date & time 04/19/14  1758 History   First MD Initiated Contact with Patient 04/19/14 1832     Chief Complaint  Patient presents with  . Chest Pain   (Consider location/radiation/quality/duration/timing/severity/associated sxs/prior Treatment) HPI  Chest pain: started at 16:30. "feels off." felt like hard to catch breath. THoughts of having seizure kept replaying in his mind. Felt like he was in a tunnel. Drank 2 cups of coffee shortly before symptoms started. Located in the center of the chest. Improves w/ burping. Lying down and relaxing with eyes closed improves pain.   Narcotics previously prescribed for kidney stones. 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 Stopped taking narcotics 4-5 days ago. Previously taking twice daily.   Last Marijuana around 00:00 today.   Past Medical History  Diagnosis Date  . Hypertension   . PTSD (post-traumatic stress disorder)   . Traumatic brain injury   . Kidney stones    History reviewed. No pertinent past surgical history. Family History  Problem Relation Age of Onset  . Cancer Other     mesothelioma   History  Substance Use Topics  . Smoking status: Current Every Day Smoker -- 0.50 packs/day    Types: Cigarettes  . Smokeless tobacco: Not on file  . Alcohol Use: No     Comment: Quit 06/27/2012    Review of Systems Per HPI with all other pertinent systems negative.   Allergies  Review of patient's allergies indicates no known allergies.  Home Medications   Prior to Admission medications   Medication Sig Start Date End Date Taking? Authorizing Provider  HYDROcodone-acetaminophen (NORCO/VICODIN) 5-325 MG per tablet Take 1 tablet by mouth every 6 (six) hours as needed for moderate pain.    Historical Provider, MD  ibuprofen (ADVIL,MOTRIN) 800 MG tablet Take 1 tablet (800 mg total) by mouth every 8 (eight) hours as needed for mild pain. 12/25/13   Kristen N Ward, DO  omeprazole  (PRILOSEC) 40 MG capsule Take 1 capsule (40 mg total) by mouth daily. 04/19/14   Ozella Rocksavid J Hawley Pavia, MD  oxyCODONE-acetaminophen (PERCOCET) 5-325 MG per tablet Take 1-2 tablets by mouth every 6 (six) hours as needed for severe pain. 12/24/13   Kathrynn Speedobyn M Hess, PA-C  oxyCODONE-acetaminophen (PERCOCET/ROXICET) 5-325 MG per tablet Take 1 tablet by mouth every 4 (four) hours as needed. 12/25/13   Kristen N Ward, DO  promethazine (PHENERGAN) 25 MG tablet Take 1 tablet (25 mg total) by mouth every 6 (six) hours as needed for nausea or vomiting. 12/25/13   Kristen N Ward, DO  tamsulosin (FLOMAX) 0.4 MG CAPS capsule Take 1 capsule (0.4 mg total) by mouth daily. 12/24/13   Robyn M Hess, PA-C   BP 124/81 mmHg  Pulse 87  Temp(Src) 97.3 F (36.3 C) (Oral)  Resp 16  SpO2 97% Physical Exam  Constitutional: He is oriented to person, place, and time. He appears well-developed and well-nourished. No distress.  HENT:  Head: Normocephalic and atraumatic.  Eyes: EOM are normal. Pupils are equal, round, and reactive to light.  Neck: Normal range of motion.  Cardiovascular: Normal rate, normal heart sounds and intact distal pulses.   No murmur heard. Pulmonary/Chest: Effort normal and breath sounds normal.  Abdominal: Soft. Bowel sounds are normal.  Musculoskeletal: Normal range of motion. He exhibits no edema or tenderness.  Neurological: He is alert and oriented to person, place, and time.  Skin: Skin is warm. He is not diaphoretic.  Psychiatric: He has a normal mood and affect. His behavior is normal. Judgment normal.    ED Course  Procedures (including critical care time) Labs Review Labs Reviewed - No data to display  Imaging Review No results found.   MDM   1. Chest pain, unspecified chest pain type   2. Reflux   3. Anxiety    Chest pain etiology likely multifactorial. Given GI cocktail in clinic with improvement. Likely related to reflux, anxiety, and change of narcotic regimen. 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 Prescription given for Prilosec. Patient advised to significantly decrease caffeine intake. Patient to seek immediate medical attention if chest pain returns with other classic symptomatology of cardiac etiology. Patient expresses understanding Precautions given and all questions answered  Shelly Flatten, MD Family Medicine 04/19/2014, 7:06 PM       EKG: NSR, no sign of ACS, No previous to compare. Rate 70s.   Ozella Rocks, MD 04/19/14 [phone]

## 2014-04-19 NOTE — ED Notes (Signed)
C/O left-sided chest pain that started today approx 30 min after drinking two cups of coffee.  Pt does not normally drink as much caffeine.  States he felt like he was "going in and out", and feels like after he breathes out it's harder to breathe back in.

## 2014-04-19 NOTE — ED Notes (Signed)
States chest pain completely resolved.

## 2015-04-13 IMAGING — CT CT ABD-PELV W/O CM
1 series · 15 of 28 positions shown, 19 images · non-contrast
Comparison: CT abdomen and pelvis without contrast 09/02/2012.

CLINICAL DATA: Right flank pain.

EXAM:
CT ABDOMEN AND PELVIS WITHOUT CONTRAST
TECHNIQUE: Multidetector CT imaging of the abdomen and pelvis was performed
following the standard protocol without IV contrast.

[Series 6: lung · axial · 0.79mm/px · z∈[+1437,+1557]mm · 15 of 28 slices shown, 19 images]
[im 3/28  soft-tissue]
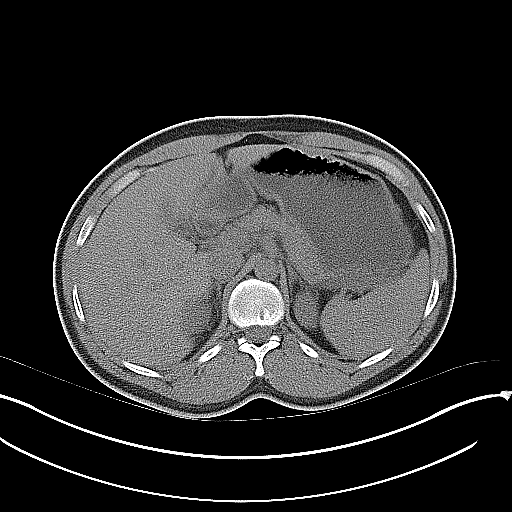
[im 3/28  bone]
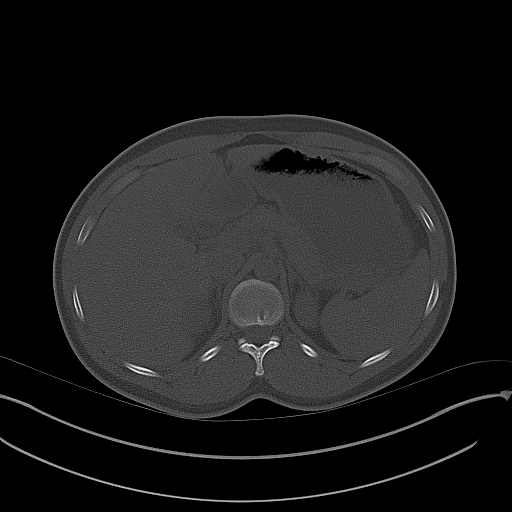
[im 5/28  soft-tissue]
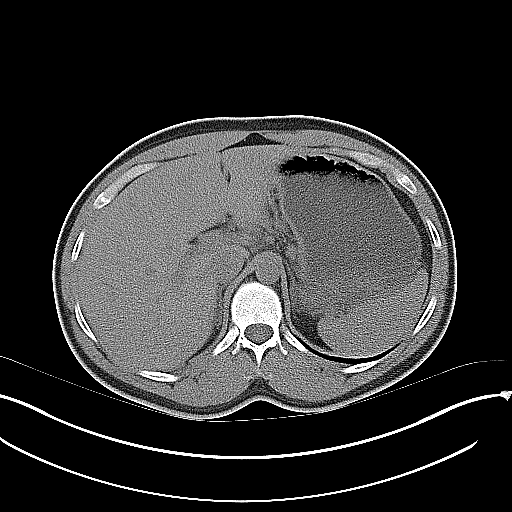
[im 7/28  soft-tissue]
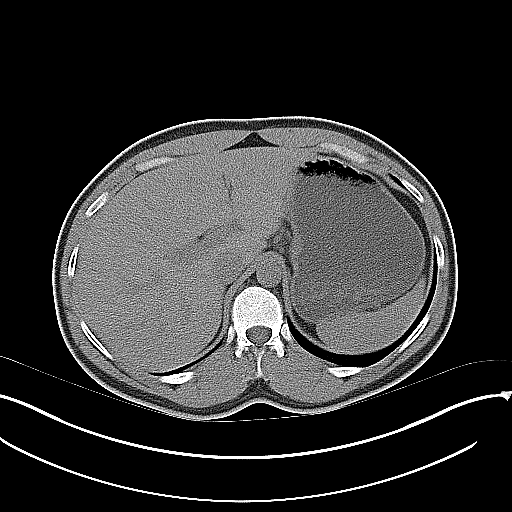
[im 9/28  soft-tissue]
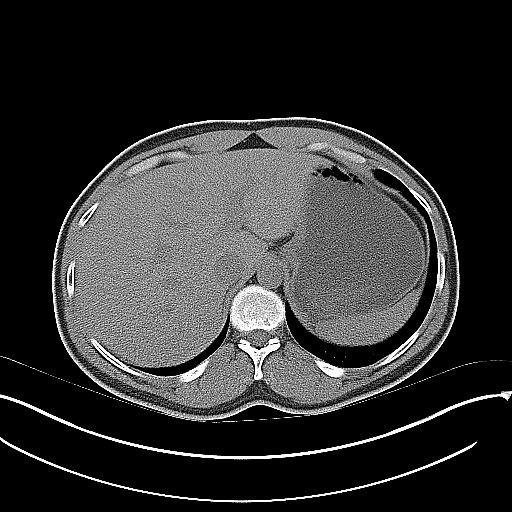
[im 11/28  soft-tissue]
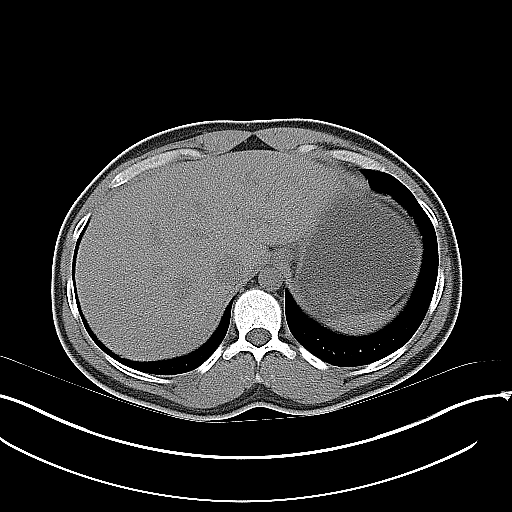
[im 13/28  soft-tissue]
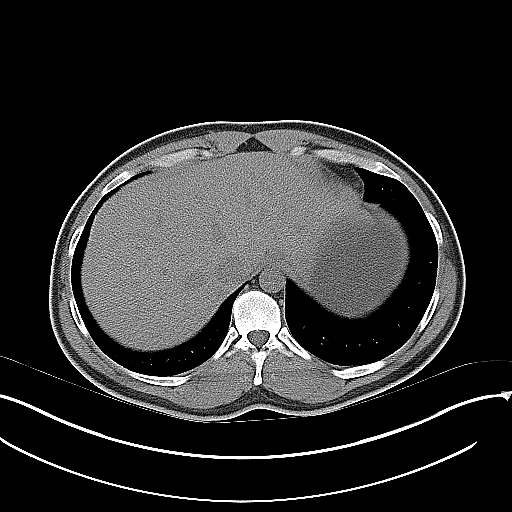
[im 15/28  soft-tissue]
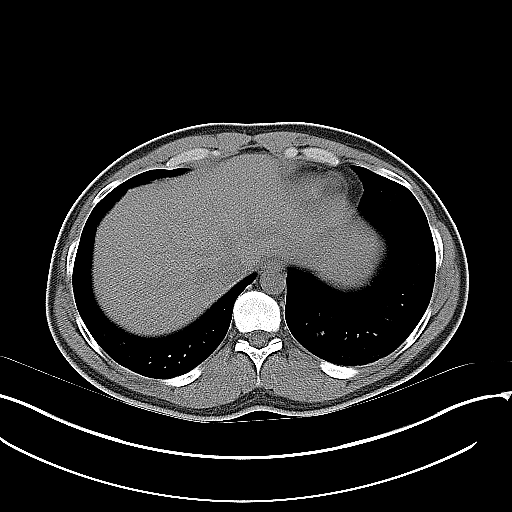
[im 17/28  soft-tissue]
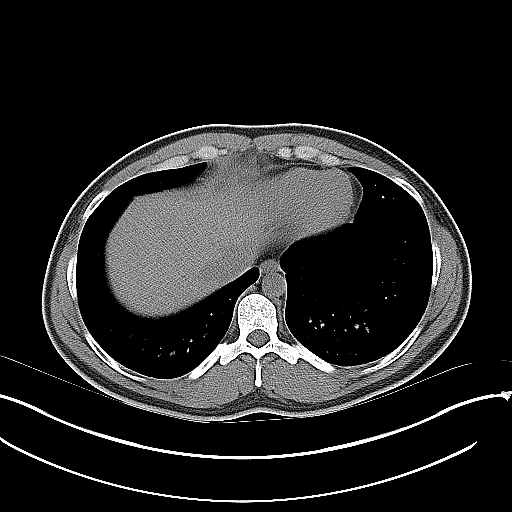
[im 19/28  soft-tissue]
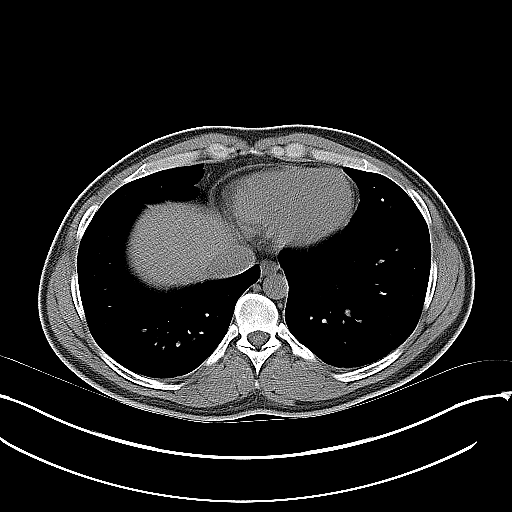
[im 19/28  bone]
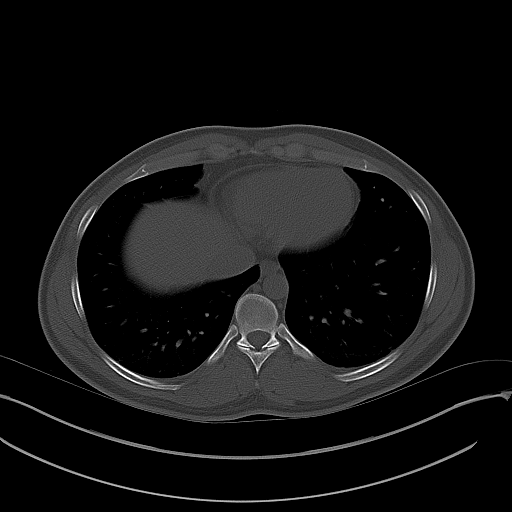
[im 21/28  soft-tissue]
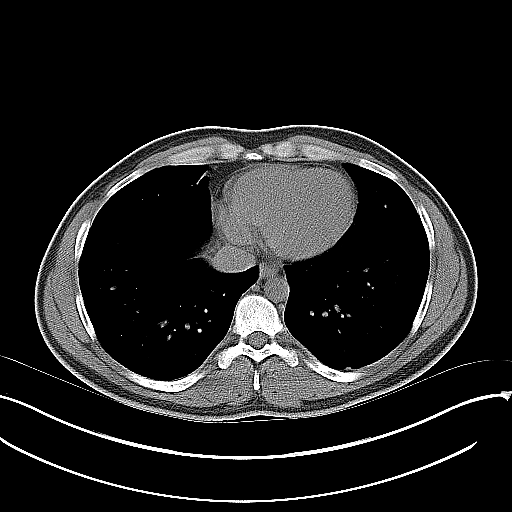
[im 23/28  soft-tissue]
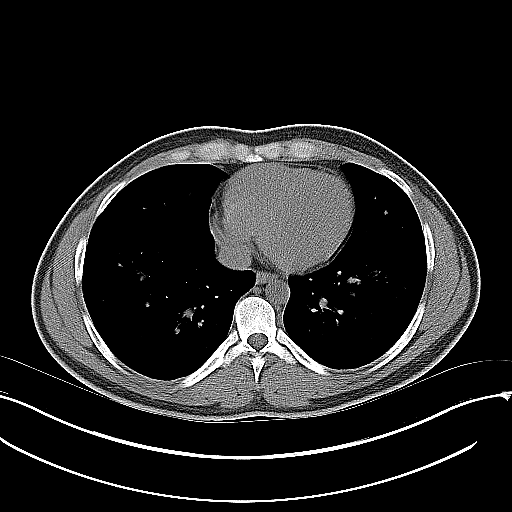
[im 24/28  lung]
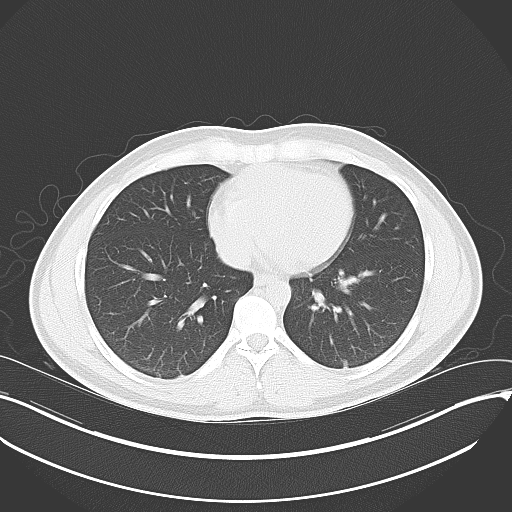
[im 25/28  soft-tissue]
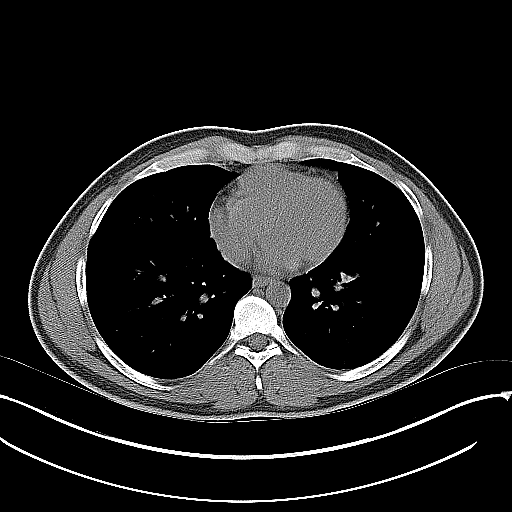
[im 25/28  lung]
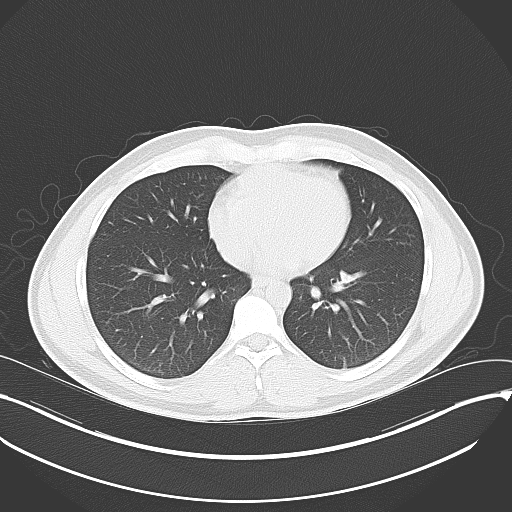
[im 26/28  lung]
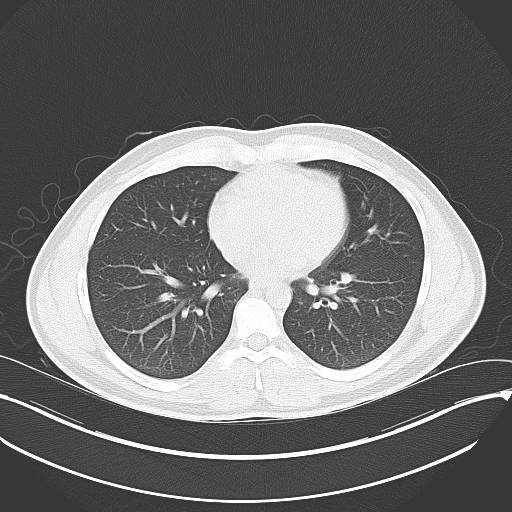
[im 27/28  soft-tissue]
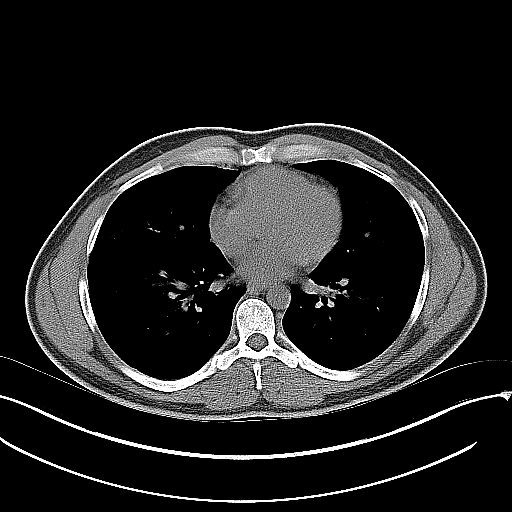
[im 27/28  lung]
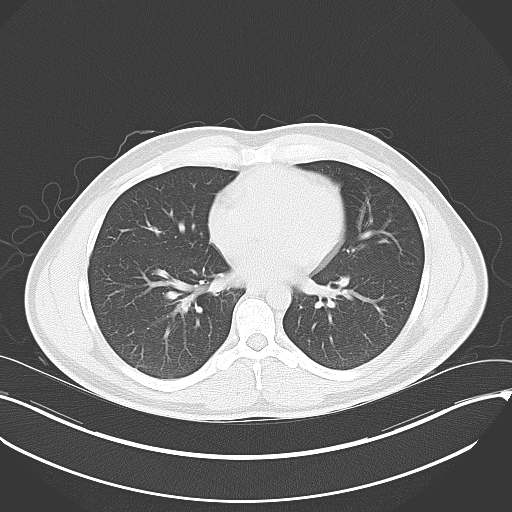

[15 of 28 positions shown; findings below may reference images not displayed]

FINDINGS: Mild dependent atelectasis is present bilaterally. No focal nodule,
mass, or airspace disease is present. The heart size is normal.
There is no significant pleural or pericardial effusion.

The liver and spleen are within normal limits. The stomach,
duodenum, and pancreas are unremarkable. Common bile duct and
gallbladder are normal. The adrenal glands are normal bilaterally.
Three separate nonobstructing stones are present in the left kidney.
These have increased in size since the prior study. The largest
lesion is in the midportion of the left kidney measuring 7 mm. Two
obstructing stones are present in the distal right ureter the more
distal stone is 3 mm. Linear density is seen within the slightly
more proximal dilated ureter. The entire ureter is dilated. Moderate
right-sided hydronephrosis is present. Five additional
nonobstructing stones are present in the lower half of the right
kidney. The largest measures 5.7 mm.

The and rectosigmoid colon is within normal limits. The remainder
the colon is unremarkable. The appendix is visualized and normal.
The small bowel is within normal limits. A small amount of free
fluid is present. No significant retroperitoneal adenopathy is
present.

The bone windows are unremarkable.
IMPRESSION: 1. Obstructing 3 mm stone in the distal right ureter near the UVJ.
Additional calcification is present just proximal to this within a
dilated ureter.
2. Additional nonobstructing stones in the kidneys bilaterally
measuring up to 5.7 mm on the right 10 7 mm on the left.
